# Patient Record
Sex: Male | Born: 1990 | Race: White | Hispanic: No | Marital: Married | Smoking: Never smoker
Health system: Southern US, Community
[De-identification: ages and names within clinical notes are randomized; demographics above are authoritative.]

---

## 2010-11-07 ENCOUNTER — Emergency Department (HOSPITAL_COMMUNITY)
Admission: EM | Admit: 2010-11-07 | Discharge: 2010-11-07 | Disposition: A | Discharge status: Home / Self Care | Payer: Self-pay | Attending: Emergency Medicine | Admitting: Emergency Medicine

## 2010-11-07 ENCOUNTER — Emergency Department (HOSPITAL_COMMUNITY): Payer: Self-pay

## 2010-11-07 DIAGNOSIS — R5381 Other malaise: Secondary | ICD-10-CM | POA: Insufficient documentation

## 2010-11-07 DIAGNOSIS — R112 Nausea with vomiting, unspecified: Secondary | ICD-10-CM | POA: Insufficient documentation

## 2010-11-07 DIAGNOSIS — F1010 Alcohol abuse, uncomplicated: Secondary | ICD-10-CM | POA: Insufficient documentation

## 2010-11-07 DIAGNOSIS — R5383 Other fatigue: Secondary | ICD-10-CM | POA: Insufficient documentation

## 2010-11-07 DIAGNOSIS — R079 Chest pain, unspecified: Secondary | ICD-10-CM | POA: Insufficient documentation

## 2010-11-07 LAB — CK TOTAL AND CKMB (NOT AT ARMC)
CK, MB: 4.6 ng/mL — ABNORMAL HIGH (ref 0.3–4.0)
Total CK: 519 U/L — ABNORMAL HIGH (ref 7–232)

## 2010-11-07 LAB — ETHANOL: Alcohol, Ethyl (B): 164 mg/dL — ABNORMAL HIGH (ref 0–10)

## 2010-11-07 LAB — POCT I-STAT, CHEM 8
BUN: 7 mg/dL (ref 6–23)
Calcium, Ion: 1.08 mmol/L — ABNORMAL LOW (ref 1.12–1.32)
Chloride: 104 mEq/L (ref 96–112)
HCT: 48 % (ref 39.0–52.0)
Potassium: 3.9 mEq/L (ref 3.5–5.1)
Sodium: 145 mEq/L (ref 135–145)

## 2010-11-07 LAB — TROPONIN I: Troponin I: <0.3 ng/mL (ref <0.30)

## 2016-09-26 ENCOUNTER — Emergency Department (HOSPITAL_BASED_OUTPATIENT_CLINIC_OR_DEPARTMENT_OTHER): Payer: Self-pay

## 2016-09-26 ENCOUNTER — Emergency Department (HOSPITAL_BASED_OUTPATIENT_CLINIC_OR_DEPARTMENT_OTHER)
Admission: EM | Admit: 2016-09-26 | Discharge: 2016-09-26 | Disposition: A | Discharge status: Home / Self Care | Payer: Self-pay | Attending: Emergency Medicine | Admitting: Emergency Medicine

## 2016-09-26 ENCOUNTER — Encounter (HOSPITAL_BASED_OUTPATIENT_CLINIC_OR_DEPARTMENT_OTHER): Payer: Self-pay | Admitting: Emergency Medicine

## 2016-09-26 DIAGNOSIS — Y93G3 Activity, cooking and baking: Secondary | ICD-10-CM | POA: Insufficient documentation

## 2016-09-26 DIAGNOSIS — Y929 Unspecified place or not applicable: Secondary | ICD-10-CM | POA: Insufficient documentation

## 2016-09-26 DIAGNOSIS — T23069A Burn of unspecified degree of back of unspecified hand, initial encounter: Secondary | ICD-10-CM

## 2016-09-26 DIAGNOSIS — T23062A Burn of unspecified degree of back of left hand, initial encounter: Secondary | ICD-10-CM | POA: Insufficient documentation

## 2016-09-26 DIAGNOSIS — Z23 Encounter for immunization: Secondary | ICD-10-CM | POA: Insufficient documentation

## 2016-09-26 DIAGNOSIS — L03114 Cellulitis of left upper limb: Secondary | ICD-10-CM | POA: Insufficient documentation

## 2016-09-26 DIAGNOSIS — Y999 Unspecified external cause status: Secondary | ICD-10-CM | POA: Insufficient documentation

## 2016-09-26 DIAGNOSIS — X19XXXA Contact with other heat and hot substances, initial encounter: Secondary | ICD-10-CM | POA: Insufficient documentation

## 2016-09-26 MED ORDER — DOXYCYCLINE HYCLATE 100 MG PO CAPS: 100.0000 mg | ORAL_CAPSULE | Freq: Two times a day (BID) | ORAL | 0 refills | Status: AC

## 2016-09-26 MED ORDER — BACITRACIN ZINC 500 UNIT/GM EX OINT
TOPICAL_OINTMENT | Freq: Two times a day (BID) | CUTANEOUS | Status: DC
  Administered 2016-09-26: 1 via TOPICAL
  Filled 2016-09-26: qty 28.35

## 2016-09-26 MED ORDER — CEPHALEXIN 500 MG PO CAPS: 500.0000 mg | ORAL_CAPSULE | Freq: Four times a day (QID) | ORAL | 0 refills | Status: AC

## 2016-09-26 MED ORDER — TETANUS-DIPHTH-ACELL PERTUSSIS 5-2.5-18.5 LF-MCG/0.5 IM SUSP
0.5000 mL | Freq: Once | INTRAMUSCULAR | Status: AC
  Administered 2016-09-26: 0.5 mL via INTRAMUSCULAR
  Filled 2016-09-26: qty 0.5

## 2016-09-26 MED ORDER — LIDOCAINE HCL (PF) 1 % IJ SOLN
INTRAMUSCULAR | Status: AC
  Administered 2016-09-26: 2.5 mL
  Filled 2016-09-26: qty 5

## 2016-09-26 MED ORDER — CEFTRIAXONE SODIUM 1 G IJ SOLR
1.0000 g | Freq: Once | INTRAMUSCULAR | Status: AC
  Administered 2016-09-26: 1 g via INTRAMUSCULAR
  Filled 2016-09-26: qty 10
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
## 2016-09-26 NOTE — Discharge Instructions (Signed)
X-ray is normal. This is likely an infection of the skin surrounding the wound. Please start taking antibiotics. Wound recheck in 3-4 days. Return sooner if he develop any redness that streaks up her arm, worsening swelling, fever, worsening pain. Motrin and Tylenol. Use anabolic ointment at home over the wound.

## 2016-09-26 NOTE — ED Notes (Signed)
Pt verbalized understanding of discharge instructions and denies any further questions at this time.   Instructions reviewed by PA at bedside.
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
